# Patient Record
Sex: Male | Born: 2000 | Race: Black or African American | Hispanic: No | Marital: Single | Smoking: Never smoker
Health system: Southern US, Community
[De-identification: ages and names within clinical notes are randomized; demographics above are authoritative.]

## PROBLEM LIST (undated history)

## (undated) DIAGNOSIS — F840 Autistic disorder: Secondary | ICD-10-CM

---
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
## 2022-01-05 ENCOUNTER — Telehealth: Payer: Self-pay

## 2022-01-05 NOTE — Telephone Encounter (Signed)
Left vm to confirm 01/11/22 appointment-Toni ?

## 2022-01-11 ENCOUNTER — Ambulatory Visit (INDEPENDENT_AMBULATORY_CARE_PROVIDER_SITE_OTHER): Payer: BC Managed Care – PPO | Admitting: Physician Assistant

## 2022-01-11 ENCOUNTER — Encounter: Payer: Self-pay | Admitting: Physician Assistant

## 2022-01-11 DIAGNOSIS — R5383 Other fatigue: Secondary | ICD-10-CM

## 2022-01-11 DIAGNOSIS — J301 Allergic rhinitis due to pollen: Secondary | ICD-10-CM

## 2022-01-11 DIAGNOSIS — Z7689 Persons encountering health services in other specified circumstances: Secondary | ICD-10-CM | POA: Diagnosis not present

## 2022-01-11 NOTE — Progress Notes (Signed)
[facility] [address] [place], [place] [address]  Internal MEDICINE  Office Visit Note  Patient Name: Edwin Baker  [id number]  [id number]  Date of Service: 01/11/2022   Complaints/HPI Pt is here for establishment of PCP. Chief Complaint  Patient presents with   New Patient (Initial Visit)   HPI Pt is here to establish care -Has been awhile since he last saw a provider and is transitioning from pediatrics -No medical hx, no medications, per chart has hx of autism -never been a smoker, no alcohol, no other substance use -lives with his parents, has an older sister who is out of the house, mom drove him to appt but is not present for visit and pt answers questions himself -Works at [employer], [employer], states he works full time -watches TV and plays video games in spare time -Eats out some, loves icecream and chocolate chip cookies -Goes outside and will go for walks for exercise -states he completed HS online -Does have some seasonal allergies   Current Medication: No outpatient encounter medications on file as of 01/11/2022.   No facility-administered encounter medications on file as of 01/11/2022.    Surgical History: History reviewed. No pertinent surgical history.  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 Has been tested extensively for autism.    Family History: History reviewed. No pertinent family history.  Social History   Socioeconomic History   Marital status: Single    Spouse name: Not on file   Number of children: Not on file   Years of education: Not on file   Highest education level: Not on file  Occupational History   Not on file  Tobacco Use   Smoking status: Never   Smokeless tobacco: Never  Substance and Sexual Activity   Alcohol use: No   Drug use: No   Sexual activity: Never  Other Topics Concern   Not on file  Social History  Narrative   Not on file   Social Determinants of Health   Financial Resource Strain: Not on file  Food Insecurity: Not on file  Transportation Needs: Not on file  Physical Activity: Not on file  Stress: Not on file  Social Connections: Not on file  Intimate Partner Violence: Not on file     Review of Systems  Constitutional:  Negative for chills, fatigue and unexpected weight change.  HENT:  Positive for postnasal drip. Negative for congestion, rhinorrhea, sneezing and sore throat.   Eyes:  Negative for redness.  Respiratory:  Negative for cough, chest tightness and shortness of breath.   Cardiovascular:  Negative for chest pain and palpitations.  Gastrointestinal:  Negative for abdominal pain, constipation, diarrhea, nausea and vomiting.  Genitourinary:  Negative for dysuria and frequency.  Musculoskeletal:  Negative for arthralgias, back pain, joint swelling and neck pain.  Skin:  Negative for rash.  Allergic/Immunologic: Positive for environmental allergies.  Neurological: Negative.  Negative for tremors and numbness.  Hematological:  Negative for adenopathy. Does not bruise/bleed easily.  Psychiatric/Behavioral:  Negative for behavioral problems (Depression), sleep disturbance and suicidal ideas. The patient is not nervous/anxious.    Vital Signs: BP 116/84   Pulse 71   Temp 98.3 F (36.8 C)   Resp 16   Ht 6' (1.829 m)   Wt 174 lb (78.9 kg)   SpO2 98%   BMI 23.60 kg/m    Physical Exam Vitals  and nursing note reviewed.  Constitutional:      General: He is not in acute distress.    Appearance: He is well-developed and normal weight. He is not diaphoretic.  HENT:     Head: Normocephalic and atraumatic.     Mouth/Throat:     Pharynx: No oropharyngeal exudate.  Eyes:     Pupils: Pupils are equal, round, and reactive to light.  Neck:     Thyroid: No thyromegaly.     Vascular: No JVD.     Trachea: No tracheal deviation.  Cardiovascular:     Rate and Rhythm:  Normal rate and regular rhythm.     Heart sounds: Normal heart sounds. No murmur heard.   No friction rub. No gallop.  Pulmonary:     Effort: Pulmonary effort is normal. No respiratory distress.     Breath sounds: No wheezing or rales.  Chest:     Chest wall: No tenderness.  Abdominal:     General: Bowel sounds are normal.     Palpations: Abdomen is soft.  Musculoskeletal:        General: Normal range of motion.     Cervical back: Normal range of motion and neck supple.  Lymphadenopathy:     Cervical: No cervical adenopathy.  Skin:    General: Skin is warm and dry.  Neurological:     Mental Status: He is alert and oriented to person, place, and time.     Cranial Nerves: No cranial nerve deficit.  Psychiatric:        Behavior: Behavior normal.        Thought Content: Thought content normal.        Judgment: Judgment normal.      Assessment/Plan: 1. Seasonal allergic rhinitis due to pollen Will try antihistamine and nasal spray as needed  2. Encounter to establish care with new doctor Reviewed medical hx - CBC w/Diff/Platelet - Comprehensive metabolic panel - TSH + free T4 - Lipid Panel With LDL/HDL Ratio  3. Other fatigue Will order routine fasting labs - CBC w/Diff/Platelet - Comprehensive metabolic panel - TSH + free T4 - Lipid Panel With LDL/HDL Ratio   General Counseling: Ridwan verbalizes understanding of the findings of todays visit and agrees with plan of treatment. I have discussed any further diagnostic evaluation that may be needed or ordered today. We also reviewed his medications today. he has been encouraged to call the office with any questions or concerns that should arise related to todays visit.    Counseling:    Orders Placed This Encounter  Procedures   CBC w/Diff/Platelet   Comprehensive metabolic panel   TSH + free T4   Lipid Panel With LDL/HDL Ratio    No orders of the defined types were placed in this encounter.    This patient  was seen by Lynn Ito, PA-C in collaboration with Dr. Beverely Risen as a part of collaborative care agreement.   Time spent:30 Minutes

## 2022-01-12 LAB — CBC WITH DIFFERENTIAL/PLATELET
Basophils Absolute: 0.1 10*3/uL (ref 0.0–0.2)
Basos: 1 %
EOS (ABSOLUTE): 0.2 10*3/uL (ref 0.0–0.4)
Eos: 6 %
Hematocrit: 45.8 % (ref 37.5–51.0)
Hemoglobin: 15.5 g/dL (ref 13.0–17.7)
Immature Grans (Abs): 0 10*3/uL (ref 0.0–0.1)
Immature Granulocytes: 0 %
Lymphocytes Absolute: 1.8 10*3/uL (ref 0.7–3.1)
Lymphs: 43 %
MCH: 30 pg (ref 26.6–33.0)
MCHC: 33.8 g/dL (ref 31.5–35.7)
MCV: 89 fL (ref 79–97)
Monocytes Absolute: 0.4 10*3/uL (ref 0.1–0.9)
Monocytes: 9 %
Neutrophils Absolute: 1.7 10*3/uL (ref 1.4–7.0)
Neutrophils: 41 %
Platelets: 257 10*3/uL (ref 150–450)
RBC: 5.16 x10E6/uL (ref 4.14–5.80)
RDW: 12.9 % (ref 11.6–15.4)
WBC: 4.2 10*3/uL (ref 3.4–10.8)

## 2022-01-12 LAB — LIPID PANEL WITH LDL/HDL RATIO
Cholesterol, Total: 178 mg/dL — ABNORMAL HIGH (ref 100–169)
HDL: 43 mg/dL (ref >39)
LDL Chol Calc (NIH): 119 mg/dL — ABNORMAL HIGH (ref 0–109)
LDL/HDL Ratio: 2.8 ratio (ref 0.0–3.6)
Triglycerides: 87 mg/dL (ref 0–89)
VLDL Cholesterol Cal: 16 mg/dL (ref 5–40)

## 2022-01-12 LAB — COMPREHENSIVE METABOLIC PANEL
ALT: 12 IU/L (ref 0–44)
AST: 21 IU/L (ref 0–40)
Albumin/Globulin Ratio: 1.6 (ref 1.2–2.2)
Albumin: 4.5 g/dL (ref 4.1–5.2)
Alkaline Phosphatase: 96 IU/L (ref 51–125)
BUN/Creatinine Ratio: 14 (ref 9–20)
BUN: 10 mg/dL (ref 6–20)
Bilirubin Total: 0.3 mg/dL (ref 0.0–1.2)
CO2: 22 mmol/L (ref 20–29)
Calcium: 9.5 mg/dL (ref 8.7–10.2)
Chloride: 100 mmol/L (ref 96–106)
Creatinine, Ser: 0.72 mg/dL — ABNORMAL LOW (ref 0.76–1.27)
Globulin, Total: 2.9 g/dL (ref 1.5–4.5)
Glucose: 99 mg/dL (ref 70–99)
Potassium: 4.5 mmol/L (ref 3.5–5.2)
Sodium: 139 mmol/L (ref 134–144)
Total Protein: 7.4 g/dL (ref 6.0–8.5)
eGFR: 135 mL/min/{1.73_m2} (ref >59)

## 2022-01-12 LAB — TSH+FREE T4
Free T4: 1.03 ng/dL (ref 0.93–1.60)
TSH: 1.46 u[IU]/mL (ref 0.450–4.500)

## 2022-01-16 ENCOUNTER — Encounter: Payer: Self-pay | Admitting: Physician Assistant

## 2022-01-17 ENCOUNTER — Telehealth: Payer: Self-pay

## 2022-01-17 NOTE — Telephone Encounter (Signed)
-----   Message from Carlean Jews, PA-C sent at 01/17/2022  1:03 PM EDT ----- ?Please let patient know that overall his labs looked ok except his cholesterol is elevated and he should work on diet and exercise. Avoid fried/fatty foods ?

## 2022-01-17 NOTE — Telephone Encounter (Signed)
Spoke with patient's Mother regarding lab results on 01/17/2022. ?

## 2022-05-14 ENCOUNTER — Ambulatory Visit (INDEPENDENT_AMBULATORY_CARE_PROVIDER_SITE_OTHER): Payer: BC Managed Care – PPO | Admitting: Physician Assistant

## 2022-05-14 ENCOUNTER — Encounter: Payer: Self-pay | Admitting: Physician Assistant

## 2022-05-14 VITALS — BP 143/74 | HR 74 | Temp 98.3°F | Resp 16 | Ht 72.0 in | Wt 178.0 lb

## 2022-05-14 DIAGNOSIS — R300 Dysuria: Secondary | ICD-10-CM | POA: Diagnosis not present

## 2022-05-14 DIAGNOSIS — Z0001 Encounter for general adult medical examination with abnormal findings: Secondary | ICD-10-CM

## 2022-05-14 DIAGNOSIS — F419 Anxiety disorder, unspecified: Secondary | ICD-10-CM | POA: Diagnosis not present

## 2022-05-14 DIAGNOSIS — G479 Sleep disorder, unspecified: Secondary | ICD-10-CM

## 2022-05-14 MED ORDER — HYDROXYZINE PAMOATE 25 MG PO CAPS: 25.0000 mg | ORAL_CAPSULE | Freq: Three times a day (TID) | ORAL | 3 refills | Status: AC | PRN

## 2022-05-14 NOTE — Progress Notes (Signed)
[facility] [address] [place], [place] [address]  Internal MEDICINE  Office Visit Note  Patient Name: Edwin Baker  [id number]  [id number]  Date of Service: 05/18/2022  Chief Complaint  Patient presents with   Annual Exam     HPI Pt is here for routine health maintenance examination -Some anxiety and nerves with changes with work. Mom reports when he gets stressed and overwhelmed he may start crying. Wonders if something to take in these instances -Continues to work at [employer] 5 days per week -Sometimes will oversleep if up late playing video games after working late and needs to get to work the next morning -Normally goes to bed until 4am sometimes because he will stay up playing video games. -Discussed working on setting sleep routine and increasing total sleep time  -Labs previously reviewed and will work on cholesterol  Current Medication: Outpatient Encounter Medications as of 05/14/2022  Medication Sig   hydrOXYzine (VISTARIL) 25 MG capsule Take 1 capsule (25 mg total) by mouth every 8 (eight) hours as needed.   No facility-administered encounter medications on file as of 05/14/2022.    Surgical History: History reviewed. No pertinent surgical history.  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 Has been tested extensively for autism.    Family History: History reviewed. No pertinent family history.    Review of Systems  Constitutional:  Negative for chills, fatigue and unexpected weight change.  HENT:  Positive for postnasal drip. Negative for congestion, rhinorrhea, sneezing and sore throat.   Eyes:  Negative for redness.  Respiratory:  Negative for cough, chest tightness and shortness of breath.   Cardiovascular:  Negative for chest pain and palpitations.  Gastrointestinal:  Negative for abdominal pain, constipation, diarrhea, nausea and vomiting.   Genitourinary:  Negative for dysuria and frequency.  Musculoskeletal:  Negative for arthralgias, back pain, joint swelling and neck pain.  Skin:  Negative for rash.  Allergic/Immunologic: Positive for environmental allergies.  Neurological: Negative.  Negative for tremors and numbness.  Hematological:  Negative for adenopathy. Does not bruise/bleed easily.  Psychiatric/Behavioral:  Positive for sleep disturbance. Negative for behavioral problems (Depression) and suicidal ideas. The patient is nervous/anxious.      Vital Signs: BP (!) 143/74   Pulse 74   Temp 98.3 F (36.8 C)   Resp 16   Ht 6' (1.829 m)   Wt 178 lb (80.7 kg)   SpO2 99%   BMI 24.14 kg/m    Physical Exam Vitals and nursing note reviewed.  Constitutional:      General: He is not in acute distress.    Appearance: He is well-developed and normal weight. He is not diaphoretic.  HENT:     Head: Normocephalic and atraumatic.     Mouth/Throat:     Pharynx: No oropharyngeal exudate.  Eyes:     Pupils: Pupils are equal, round, and reactive to light.  Neck:     Thyroid: No thyromegaly.     Vascular: No JVD.     Trachea: No tracheal deviation.  Cardiovascular:     Rate and Rhythm: Normal rate and regular rhythm.     Heart sounds: Normal heart sounds. No murmur heard.    No friction rub. No gallop.  Pulmonary:     Effort: Pulmonary effort is normal. No respiratory distress.     Breath sounds: No wheezing or rales.  Chest:  Chest wall: No tenderness.  Abdominal:     General: Bowel sounds are normal.     Palpations: Abdomen is soft.  Musculoskeletal:        General: Normal range of motion.     Cervical back: Normal range of motion and neck supple.  Lymphadenopathy:     Cervical: No cervical adenopathy.  Skin:    General: Skin is warm and dry.  Neurological:     Mental Status: He is alert and oriented to person, place, and time.     Cranial Nerves: No cranial nerve deficit.  Psychiatric:        Thought  Content: Thought content normal.        Judgment: Judgment normal.     Comments: Avoids eye contact      LABS: Recent Results (from the past 2160 hour(s))  UA/M w/rflx Culture, Routine     Status: None   Collection Time: 05/14/22  3:31 PM   Specimen: Urine   Urine  Result Value Ref Range   Specific Gravity, UA 1.019 1.005 - 1.030   pH, UA 7.0 5.0 - 7.5   Color, UA Yellow Yellow   Appearance Ur Clear Clear   Leukocytes,UA Negative Negative   Protein,UA Negative Negative/Trace   Glucose, UA Negative Negative   Ketones, UA Negative Negative   RBC, UA Negative Negative   Bilirubin, UA Negative Negative   Urobilinogen, Ur 1.0 0.2 - 1.0 mg/dL   Nitrite, UA Negative Negative   Microscopic Examination Comment     Comment: Microscopic follows if indicated.   Microscopic Examination See below:     Comment: Microscopic was indicated and was performed.   Urinalysis Reflex Comment     Comment: This specimen will not reflex to a Urine Culture.  Microscopic Examination     Status: None   Collection Time: 05/14/22  3:31 PM   Urine  Result Value Ref Range   WBC, UA None seen 0 - 5 /hpf   RBC, Urine None seen 0 - 2 /hpf   Epithelial Cells (non renal) None seen 0 - 10 /hpf   Casts None seen None seen /lpf   Bacteria, UA None seen None seen/Few        Assessment/Plan: 1. Encounter for general adult medical examination with abnormal findings CPE performed, routine labs reviewed  2. Anxiety Will start on hydroxyzine to be taken as needed. Will contact office if any worsening - hydrOXYzine (VISTARIL) 25 MG capsule; Take 1 capsule (25 mg total) by mouth every 8 (eight) hours as needed.  Dispense: 30 capsule; Refill: 3  3. Sleep disturbance Will work on establishing better sleep routine. Needs to get to bed sooner and keep consistent routine. Set time to shut off screens  4. Dysuria - UA/M w/rflx Culture, Routine - Microscopic Examination   General Counseling: Grant verbalizes  understanding of the findings of todays visit and agrees with plan of treatment. I have discussed any further diagnostic evaluation that may be needed or ordered today. We also reviewed his medications today. he has been encouraged to call the office with any questions or concerns that should arise related to todays visit.    Counseling:    Orders Placed This Encounter  Procedures   Microscopic Examination   UA/M w/rflx Culture, Routine    Meds ordered this encounter  Medications   hydrOXYzine (VISTARIL) 25 MG capsule    Sig: Take 1 capsule (25 mg total) by mouth every 8 (eight) hours as needed.  Dispense:  30 capsule    Refill:  3    This patient was seen by Lynn Ito, PA-C in collaboration with Dr. Beverely Risen as a part of collaborative care agreement.  Total time spent:35 Minutes  Time spent includes review of chart, medications, test results, and follow up plan with the patient.     Lyndon Code, MD  Internal Medicine

## 2022-05-15 LAB — UA/M W/RFLX CULTURE, ROUTINE
Bilirubin, UA: NEGATIVE
Glucose, UA: NEGATIVE
Ketones, UA: NEGATIVE
Leukocytes,UA: NEGATIVE
Nitrite, UA: NEGATIVE
Protein,UA: NEGATIVE
RBC, UA: NEGATIVE
Specific Gravity, UA: 1.019 (ref 1.005–1.030)
Urobilinogen, Ur: 1 mg/dL (ref 0.2–1.0)
pH, UA: 7 (ref 5.0–7.5)

## 2022-05-15 LAB — MICROSCOPIC EXAMINATION
Bacteria, UA: NONE SEEN
Casts: NONE SEEN /lpf
Epithelial Cells (non renal): NONE SEEN /hpf (ref 0–10)
RBC, Urine: NONE SEEN /hpf (ref 0–2)
WBC, UA: NONE SEEN /hpf (ref 0–5)

## 2022-10-18 ENCOUNTER — Telehealth: Payer: Self-pay

## 2022-10-18 NOTE — Telephone Encounter (Signed)
Pt mom called for refills for hydroxyzine advised her that lauren send on 9/23 with refills she is going to check with [facility] if she don't have we can refill

## 2022-11-15 ENCOUNTER — Ambulatory Visit: Payer: BC Managed Care – PPO | Admitting: Physician Assistant

## 2023-05-20 ENCOUNTER — Encounter: Payer: Self-pay | Admitting: Physician Assistant

## 2023-05-20 ENCOUNTER — Telehealth: Payer: Self-pay | Admitting: Physician Assistant

## 2023-05-20 NOTE — Telephone Encounter (Signed)
Unable to lvm to pt's mother mailbox is full-nm

## 2023-05-30 ENCOUNTER — Telehealth: Payer: Self-pay | Admitting: Physician Assistant

## 2023-05-30 NOTE — Telephone Encounter (Signed)
unable to lvm to pt regarding rescheduling missed appt 05/20/23

## 2023-06-11 ENCOUNTER — Telehealth: Payer: Self-pay | Admitting: Physician Assistant

## 2023-06-11 NOTE — Telephone Encounter (Signed)
Received MR request from [facility]. Faxed MR; [id number]

## 2023-08-05 ENCOUNTER — Encounter: Payer: Self-pay | Admitting: Physician Assistant

## 2023-09-17 DIAGNOSIS — Z6828 Body mass index (BMI) 28.0-28.9, adult: Secondary | ICD-10-CM | POA: Diagnosis not present

## 2023-09-17 DIAGNOSIS — M791 Myalgia, unspecified site: Secondary | ICD-10-CM | POA: Diagnosis not present

## 2023-09-17 DIAGNOSIS — Z1152 Encounter for screening for COVID-19: Secondary | ICD-10-CM | POA: Diagnosis not present

## 2023-09-17 DIAGNOSIS — R112 Nausea with vomiting, unspecified: Secondary | ICD-10-CM | POA: Diagnosis not present

## 2023-09-17 DIAGNOSIS — R1084 Generalized abdominal pain: Secondary | ICD-10-CM | POA: Diagnosis not present

## 2023-09-17 DIAGNOSIS — R197 Diarrhea, unspecified: Secondary | ICD-10-CM | POA: Diagnosis not present

## 2023-10-25 ENCOUNTER — Telehealth: Payer: Self-pay | Admitting: Physician Assistant

## 2023-10-25 NOTE — Telephone Encounter (Signed)
Received vm from [facility] regarding medical records requested last October. I lvm stating that 22 pages of medical records were faxed back to them @; [phone] on 06/11/23-Toni

## 2024-07-06 DIAGNOSIS — F331 Major depressive disorder, recurrent, moderate: Secondary | ICD-10-CM | POA: Diagnosis not present
